# Patient Record
Sex: Female | Born: 1985
Health system: Southern US, Community
[De-identification: ages and names within clinical notes are randomized; demographics above are authoritative.]

## PROBLEM LIST (undated history)

## (undated) DIAGNOSIS — F1010 Alcohol abuse, uncomplicated: Secondary | ICD-10-CM

## (undated) DIAGNOSIS — I10 Essential (primary) hypertension: Secondary | ICD-10-CM

## (undated) HISTORY — DX: Alcohol abuse, uncomplicated: F10.10

## (undated) HISTORY — DX: Essential (primary) hypertension: I10

---

## 2016-12-16 ENCOUNTER — Encounter (HOSPITAL_BASED_OUTPATIENT_CLINIC_OR_DEPARTMENT_OTHER): Payer: Self-pay

## 2016-12-16 ENCOUNTER — Emergency Department (HOSPITAL_BASED_OUTPATIENT_CLINIC_OR_DEPARTMENT_OTHER)
Admission: EM | Admit: 2016-12-16 | Discharge: 2016-12-16 | Disposition: A | Discharge status: Home / Self Care | Payer: Self-pay | Attending: Emergency Medicine | Admitting: Emergency Medicine

## 2016-12-16 DIAGNOSIS — R519 Headache, unspecified: Secondary | ICD-10-CM

## 2016-12-16 DIAGNOSIS — A599 Trichomoniasis, unspecified: Secondary | ICD-10-CM | POA: Insufficient documentation

## 2016-12-16 DIAGNOSIS — E876 Hypokalemia: Secondary | ICD-10-CM | POA: Insufficient documentation

## 2016-12-16 DIAGNOSIS — R51 Headache: Secondary | ICD-10-CM | POA: Insufficient documentation

## 2016-12-16 LAB — BASIC METABOLIC PANEL
Anion gap: 14 (ref 5–15)
BUN: 14 mg/dL (ref 6–20)
CALCIUM: 8.6 mg/dL — AB (ref 8.9–10.3)
CO2: 23 mmol/L (ref 22–32)
CREATININE: 0.84 mg/dL (ref 0.44–1.00)
Chloride: 100 mmol/L — ABNORMAL LOW (ref 101–111)
GFR calc Af Amer: >60 mL/min (ref >60)
GLUCOSE: 103 mg/dL — AB (ref 65–99)
Potassium: 2.9 mmol/L — ABNORMAL LOW (ref 3.5–5.1)
Sodium: 137 mmol/L (ref 135–145)

## 2016-12-16 LAB — TSH: TSH: 0.443 u[IU]/mL (ref 0.350–4.500)

## 2016-12-16 LAB — WET PREP, GENITAL
Clue Cells Wet Prep HPF POC: NONE SEEN
Sperm: NONE SEEN
Yeast Wet Prep HPF POC: NONE SEEN

## 2016-12-16 LAB — PREGNANCY, URINE: PREG TEST UR: NEGATIVE

## 2016-12-16 LAB — CBC WITH DIFFERENTIAL/PLATELET
BASOS PCT: 1 %
Basophils Absolute: 0 10*3/uL (ref 0.0–0.1)
EOS ABS: 0 10*3/uL (ref 0.0–0.7)
Eosinophils Relative: 0 %
HCT: 41.7 % (ref 36.0–46.0)
HEMOGLOBIN: 15.3 g/dL — AB (ref 12.0–15.0)
LYMPHS ABS: 1.9 10*3/uL (ref 0.7–4.0)
Lymphocytes Relative: 24 %
MCH: 36.3 pg — AB (ref 26.0–34.0)
MCHC: 36.7 g/dL — AB (ref 30.0–36.0)
MCV: 99 fL (ref 78.0–100.0)
MONO ABS: 0.7 10*3/uL (ref 0.1–1.0)
MONOS PCT: 9 %
NEUTROS PCT: 67 %
Neutro Abs: 5.4 10*3/uL (ref 1.7–7.7)
Platelets: 170 10*3/uL (ref 150–400)
RBC: 4.21 MIL/uL (ref 3.87–5.11)
RDW: 12.3 % (ref 11.5–15.5)
WBC: 8.1 10*3/uL (ref 4.0–10.5)

## 2016-12-16 LAB — URINALYSIS, MICROSCOPIC (REFLEX)

## 2016-12-16 LAB — TROPONIN I: Troponin I: <0.03 ng/mL (ref <0.03)

## 2016-12-16 LAB — URINALYSIS, ROUTINE W REFLEX MICROSCOPIC
BILIRUBIN URINE: NEGATIVE
GLUCOSE, UA: NEGATIVE mg/dL
HGB URINE DIPSTICK: NEGATIVE
Ketones, ur: NEGATIVE mg/dL
Nitrite: NEGATIVE
PROTEIN: NEGATIVE mg/dL
SPECIFIC GRAVITY, URINE: 1.016 (ref 1.005–1.030)
pH: 7 (ref 5.0–8.0)

## 2016-12-16 MED ORDER — AZITHROMYCIN 250 MG PO TABS
2000.0000 mg | ORAL_TABLET | Freq: Once | ORAL | Status: AC
  Administered 2016-12-16: 2000 mg via ORAL
  Filled 2016-12-16: qty 8

## 2016-12-16 MED ORDER — ONDANSETRON 8 MG PO TBDP
8.0000 mg | ORAL_TABLET | Freq: Once | ORAL | Status: AC
  Administered 2016-12-16: 8 mg via ORAL
  Filled 2016-12-16: qty 1

## 2016-12-16 MED ORDER — METRONIDAZOLE 500 MG PO TABS: 500.0000 mg | ORAL_TABLET | Freq: Two times a day (BID) | ORAL | 0 refills | Status: DC

## 2016-12-16 MED ORDER — POTASSIUM CHLORIDE CRYS ER 20 MEQ PO TBCR: 20.0000 meq | EXTENDED_RELEASE_TABLET | Freq: Two times a day (BID) | ORAL | 0 refills | Status: DC

## 2016-12-16 MED ORDER — SODIUM CHLORIDE 0.9 % IV BOLUS (SEPSIS)
1000.0000 mL | Freq: Once | INTRAVENOUS | Status: AC
  Administered 2016-12-16: 1000 mL via INTRAVENOUS

## 2016-12-16 MED ORDER — AZITHROMYCIN 250 MG PO TABS: 1000.0000 mg | ORAL_TABLET | Freq: Once | ORAL | Status: DC

## 2016-12-16 MED ORDER — CIPROFLOXACIN HCL 500 MG PO TABS: ORAL_TABLET | ORAL | 0 refills | Status: DC

## 2016-12-16 MED FILL — metroNIDAZOLE 500 MG TABS: 500 | 7 days supply | Qty: 14 | Fill #0

## 2016-12-16 MED FILL — POTASSIUM CL ER 20 MEQ TABL: 20 | 5 days supply | Qty: 10 | Fill #0

## 2016-12-16 MED FILL — CIPROFLOXACIN HCL 500 MG TA: 500 | 5 days supply | Qty: 10 | Fill #0

## 2016-12-16 NOTE — Discharge Instructions (Signed)
Cipro and Flagyl as prescribed.  Potassium as prescribed.  We will call you if your cultures indicate you require further treatment or action.

## 2016-12-16 NOTE — ED Triage Notes (Signed)
C/o HA, nausea, fatigue-states she has not been dx with HTN but checks her BP and it has been elevated "for a while"-NAD-steady gait

## 2016-12-16 NOTE — ED Provider Notes (Signed)
[facility]   CSN: [id number] Arrival date & time: 12/16/16  1158     History   Chief Complaint Chief Complaint  Patient presents with  . Headache    HPI Emily English is a 31 y.o. female.  Patient is a 31 year old female who presents with a several day history of headache and not feeling well. This began in the absence of any injury or trauma. She denies any other complaints such as fever, chills, cough, sore throat, chest pain, or difficulty breathing. She does report she has been checking her blood pressure at work and it has been running high. She has no history of hypertension and is not on any medications. Last menstrual period was early this month and normal.   The history is provided by the patient.  Headache   This is a new problem. Episode onset: several days ago. The problem occurs constantly. The problem has been gradually worsening. The headache is associated with nothing. The pain is located in the frontal region. The pain is moderate. The pain does not radiate. Pertinent negatives include no fever, no malaise/fatigue, no nausea and no vomiting. The treatment provided no relief.    History reviewed. No pertinent past medical history.  There are no active problems to display for this patient.   History reviewed. No pertinent surgical history.  OB History    No data available       Home Medications    Prior to Admission medications   Not on File    Family History No family history on file.  Social History Social History  Substance Use Topics  . Smoking status: Never Smoker  . Smokeless tobacco: Never Used  . Alcohol use Yes     Comment: daily     Allergies   Keflex [cephalexin]   Review of Systems Review of Systems  Constitutional: Negative for fever and malaise/fatigue.  Gastrointestinal: Negative for nausea and vomiting.  Neurological: Positive for headaches.  All other systems reviewed and are  negative.    Physical Exam Updated Vital Signs BP (!) 140/103   Pulse (!) 118   Temp 98.4 F (36.9 C) (Oral)   Resp 18   Ht 5\' 2"  (1.575 m)   Wt 90.3 kg (199 lb)   LMP 11/29/2016   SpO2 98%   BMI 36.40 kg/m   Physical Exam  Constitutional: She is oriented to person, place, and time. She appears well-developed and well-nourished. No distress.  HENT:  Head: Normocephalic and atraumatic.  Neck: Normal range of motion. Neck supple.  Cardiovascular: Normal rate and regular rhythm.  Exam reveals no gallop and no friction rub.   No murmur heard. Pulmonary/Chest: Effort normal and breath sounds normal. No respiratory distress. She has no wheezes.  Abdominal: Soft. Bowel sounds are normal. She exhibits no distension. There is no tenderness.  Musculoskeletal: Normal range of motion.  Neurological: She is alert and oriented to person, place, and time.  Skin: Skin is warm and dry. She is not diaphoretic.  Nursing note and vitals reviewed.    ED Treatments / Results  Labs (all labs ordered are listed, but only abnormal results are displayed) Labs Reviewed  PREGNANCY, URINE  BASIC METABOLIC PANEL  TROPONIN I  CBC WITH DIFFERENTIAL/PLATELET  URINALYSIS, ROUTINE W REFLEX MICROSCOPIC    EKG  EKG Interpretation  Date/Time:  Thursday December 16 2016 12:13:32 EDT Ventricular Rate:  116 PR Interval:    QRS Duration: 88 QT Interval:  325 QTC Calculation:  452 R Axis:   -9 Text Interpretation:  Sinus tachycardia Anterior infarct, old Nonspecific T wave abnormality Confirmed by Geoffery Lyons ([id number]) on 12/16/2016 12:24:35 PM       Radiology No results found.  Procedures Procedures (including critical care time)  Medications Ordered in ED Medications  sodium chloride 0.9 % bolus 1,000 mL (not administered)     Initial Impression / Assessment and Plan / ED Course  I have reviewed the triage vital signs and the nursing notes.  Pertinent labs & imaging results that were  available during my care of the patient were reviewed by me and considered in my medical decision making (see chart for details).  Patient presents here with complaints of headache in the absence of any injury or trauma. She is neurologically intact, afebrile, with no nuchal rigidity. She is found to be somewhat tachycardic with a heart rate in the 110's, but no chest pain, shortness of breath, or tachycardia. Laboratory studies are unremarkable with the exception of mild hypokalemia with K+ of 2.9.  She will be discharged with cipro, potassium replacement, and prn follow up.  Prior to discharge, it was brought to my attention that there was trichomonas in the urinalysis. For this reason, a pelvic exam was obtained and GC and Chlamydia cultures were obtained as well. She has a cephalosporin allergy so will be treated with 2 g of by mouth Zithromax and discharged with Cipro and Flagyl for a UTI and Trichomonas.  Final Clinical Impressions(s) / ED Diagnoses   Final diagnoses:  None    New Prescriptions New Prescriptions   No medications on file     Geoffery Lyons, MD 12/16/16 1500

## 2016-12-16 NOTE — ED Notes (Signed)
ED Provider at bedside. 

## 2016-12-17 LAB — GC/CHLAMYDIA PROBE AMP (~~LOC~~) NOT AT ARMC
CHLAMYDIA, DNA PROBE: NEGATIVE
NEISSERIA GONORRHEA: NEGATIVE

## 2018-09-12 ENCOUNTER — Ambulatory Visit: Payer: Self-pay | Admitting: Family Medicine

## 2019-08-23 DIAGNOSIS — Z03818 Encounter for observation for suspected exposure to other biological agents ruled out: Secondary | ICD-10-CM | POA: Diagnosis not present

## 2019-10-31 DIAGNOSIS — R197 Diarrhea, unspecified: Secondary | ICD-10-CM | POA: Diagnosis not present

## 2019-10-31 DIAGNOSIS — R112 Nausea with vomiting, unspecified: Secondary | ICD-10-CM | POA: Diagnosis not present

## 2019-10-31 DIAGNOSIS — Z1159 Encounter for screening for other viral diseases: Secondary | ICD-10-CM | POA: Diagnosis not present

## 2019-10-31 DIAGNOSIS — Z20822 Contact with and (suspected) exposure to covid-19: Secondary | ICD-10-CM | POA: Diagnosis not present

## 2019-10-31 DIAGNOSIS — R109 Unspecified abdominal pain: Secondary | ICD-10-CM | POA: Diagnosis not present

## 2019-11-01 DIAGNOSIS — R569 Unspecified convulsions: Secondary | ICD-10-CM | POA: Diagnosis not present

## 2019-11-01 DIAGNOSIS — E876 Hypokalemia: Secondary | ICD-10-CM | POA: Diagnosis not present

## 2019-11-01 DIAGNOSIS — R410 Disorientation, unspecified: Secondary | ICD-10-CM | POA: Diagnosis not present

## 2019-11-01 DIAGNOSIS — E878 Other disorders of electrolyte and fluid balance, not elsewhere classified: Secondary | ICD-10-CM | POA: Diagnosis not present

## 2019-11-01 DIAGNOSIS — W01198A Fall on same level from slipping, tripping and stumbling with subsequent striking against other object, initial encounter: Secondary | ICD-10-CM | POA: Diagnosis not present

## 2019-11-01 DIAGNOSIS — R58 Hemorrhage, not elsewhere classified: Secondary | ICD-10-CM | POA: Diagnosis not present

## 2019-11-01 DIAGNOSIS — G40901 Epilepsy, unspecified, not intractable, with status epilepticus: Secondary | ICD-10-CM | POA: Diagnosis not present

## 2019-11-01 DIAGNOSIS — N390 Urinary tract infection, site not specified: Secondary | ICD-10-CM | POA: Diagnosis not present

## 2019-11-01 DIAGNOSIS — S0101XA Laceration without foreign body of scalp, initial encounter: Secondary | ICD-10-CM | POA: Diagnosis not present

## 2019-11-01 DIAGNOSIS — Z23 Encounter for immunization: Secondary | ICD-10-CM | POA: Diagnosis not present

## 2019-11-01 DIAGNOSIS — S0083XA Contusion of other part of head, initial encounter: Secondary | ICD-10-CM | POA: Diagnosis not present

## 2019-11-01 DIAGNOSIS — G40909 Epilepsy, unspecified, not intractable, without status epilepticus: Secondary | ICD-10-CM | POA: Diagnosis not present

## 2019-11-02 DIAGNOSIS — R000 Tachycardia, unspecified: Secondary | ICD-10-CM | POA: Diagnosis not present

## 2019-11-05 DIAGNOSIS — K760 Fatty (change of) liver, not elsewhere classified: Secondary | ICD-10-CM | POA: Diagnosis not present

## 2019-11-05 DIAGNOSIS — Z79899 Other long term (current) drug therapy: Secondary | ICD-10-CM | POA: Diagnosis not present

## 2019-11-05 DIAGNOSIS — Z20822 Contact with and (suspected) exposure to covid-19: Secondary | ICD-10-CM | POA: Diagnosis not present

## 2019-11-05 DIAGNOSIS — Z09 Encounter for follow-up examination after completed treatment for conditions other than malignant neoplasm: Secondary | ICD-10-CM | POA: Diagnosis not present

## 2019-11-05 DIAGNOSIS — S0101XS Laceration without foreign body of scalp, sequela: Secondary | ICD-10-CM | POA: Diagnosis not present

## 2019-11-09 DIAGNOSIS — Z79899 Other long term (current) drug therapy: Secondary | ICD-10-CM | POA: Diagnosis not present

## 2019-11-12 DIAGNOSIS — R945 Abnormal results of liver function studies: Secondary | ICD-10-CM | POA: Diagnosis not present

## 2019-11-12 DIAGNOSIS — S0101XD Laceration without foreign body of scalp, subsequent encounter: Secondary | ICD-10-CM | POA: Diagnosis not present

## 2020-12-04 ENCOUNTER — Emergency Department (HOSPITAL_COMMUNITY): Payer: No Typology Code available for payment source

## 2020-12-04 ENCOUNTER — Encounter (HOSPITAL_COMMUNITY): Payer: Self-pay | Admitting: Emergency Medicine

## 2020-12-04 ENCOUNTER — Emergency Department (HOSPITAL_COMMUNITY)
Admission: EM | Admit: 2020-12-04 | Discharge: 2020-12-04 | Discharge status: Against Medical Advice | Payer: No Typology Code available for payment source | Attending: Emergency Medicine | Admitting: Emergency Medicine

## 2020-12-04 DIAGNOSIS — I10 Essential (primary) hypertension: Secondary | ICD-10-CM

## 2020-12-04 DIAGNOSIS — Y907 Blood alcohol level of 200-239 mg/100 ml: Secondary | ICD-10-CM | POA: Insufficient documentation

## 2020-12-04 DIAGNOSIS — F10129 Alcohol abuse with intoxication, unspecified: Secondary | ICD-10-CM | POA: Insufficient documentation

## 2020-12-04 DIAGNOSIS — Z5321 Procedure and treatment not carried out due to patient leaving prior to being seen by health care provider: Secondary | ICD-10-CM | POA: Diagnosis not present

## 2020-12-04 DIAGNOSIS — R0789 Other chest pain: Secondary | ICD-10-CM | POA: Diagnosis not present

## 2020-12-04 DIAGNOSIS — R002 Palpitations: Secondary | ICD-10-CM | POA: Insufficient documentation

## 2020-12-04 DIAGNOSIS — E876 Hypokalemia: Secondary | ICD-10-CM | POA: Diagnosis not present

## 2020-12-04 DIAGNOSIS — R5383 Other fatigue: Secondary | ICD-10-CM

## 2020-12-04 DIAGNOSIS — F10920 Alcohol use, unspecified with intoxication, uncomplicated: Secondary | ICD-10-CM

## 2020-12-04 DIAGNOSIS — R079 Chest pain, unspecified: Secondary | ICD-10-CM

## 2020-12-04 DIAGNOSIS — R55 Syncope and collapse: Secondary | ICD-10-CM | POA: Insufficient documentation

## 2020-12-04 LAB — TROPONIN I (HIGH SENSITIVITY)
Troponin I (High Sensitivity): <2 ng/L (ref <18)
Troponin I (High Sensitivity): <2 ng/L (ref <18)

## 2020-12-04 LAB — BASIC METABOLIC PANEL
Anion gap: 10 (ref 5–15)
BUN: <5 mg/dL — ABNORMAL LOW (ref 6–20)
CO2: 29 mmol/L (ref 22–32)
Calcium: 7 mg/dL — ABNORMAL LOW (ref 8.9–10.3)
Chloride: 99 mmol/L (ref 98–111)
Creatinine, Ser: 0.52 mg/dL (ref 0.44–1.00)
GFR, Estimated: >60 mL/min (ref >60)
Glucose, Bld: 111 mg/dL — ABNORMAL HIGH (ref 70–99)
Potassium: 2.7 mmol/L — CL (ref 3.5–5.1)
Sodium: 138 mmol/L (ref 135–145)

## 2020-12-04 LAB — CBC
HCT: 37.5 % (ref 36.0–46.0)
Hemoglobin: 13.2 g/dL (ref 12.0–15.0)
MCH: 38.9 pg — ABNORMAL HIGH (ref 26.0–34.0)
MCHC: 35.2 g/dL (ref 30.0–36.0)
MCV: 110.6 fL — ABNORMAL HIGH (ref 80.0–100.0)
Platelets: 183 10*3/uL (ref 150–400)
RBC: 3.39 MIL/uL — ABNORMAL LOW (ref 3.87–5.11)
RDW: 16.5 % — ABNORMAL HIGH (ref 11.5–15.5)
WBC: 3.3 10*3/uL — ABNORMAL LOW (ref 4.0–10.5)
nRBC: 0 % (ref 0.0–0.2)

## 2020-12-04 LAB — HEPATIC FUNCTION PANEL
ALT: 26 U/L (ref 0–44)
AST: 44 U/L — ABNORMAL HIGH (ref 15–41)
Albumin: 3.5 g/dL (ref 3.5–5.0)
Alkaline Phosphatase: 47 U/L (ref 38–126)
Bilirubin, Direct: 0.1 mg/dL (ref 0.0–0.2)
Indirect Bilirubin: 0.4 mg/dL (ref 0.3–0.9)
Total Bilirubin: 0.5 mg/dL (ref 0.3–1.2)
Total Protein: 6.9 g/dL (ref 6.5–8.1)

## 2020-12-04 LAB — I-STAT BETA HCG BLOOD, ED (MC, WL, AP ONLY): I-stat hCG, quantitative: <5 m[IU]/mL (ref <5)

## 2020-12-04 LAB — TSH: TSH: 0.716 u[IU]/mL (ref 0.350–4.500)

## 2020-12-04 LAB — ETHANOL: Alcohol, Ethyl (B): 222 mg/dL — ABNORMAL HIGH (ref <10)

## 2020-12-04 LAB — MAGNESIUM: Magnesium: 1.6 mg/dL — ABNORMAL LOW (ref 1.7–2.4)

## 2020-12-04 MED ORDER — POTASSIUM CHLORIDE CRYS ER 20 MEQ PO TBCR: 20.0000 meq | EXTENDED_RELEASE_TABLET | Freq: Every day | ORAL | 0 refills | Status: AC

## 2020-12-04 MED ORDER — MAGNESIUM SULFATE IN D5W 1-5 GM/100ML-% IV SOLN
1.0000 g | Freq: Once | INTRAVENOUS | Status: DC
  Filled 2020-12-04: qty 100

## 2020-12-04 MED ORDER — LISINOPRIL 10 MG PO TABS
10.0000 mg | ORAL_TABLET | Freq: Once | ORAL | Status: AC
  Administered 2020-12-04: 10 mg via ORAL
  Filled 2020-12-04: qty 1

## 2020-12-04 MED ORDER — POTASSIUM CHLORIDE CRYS ER 20 MEQ PO TBCR
40.0000 meq | EXTENDED_RELEASE_TABLET | Freq: Once | ORAL | Status: AC
  Administered 2020-12-04: 40 meq via ORAL
  Filled 2020-12-04: qty 2

## 2020-12-04 MED ORDER — LISINOPRIL 10 MG PO TABS: 10.0000 mg | ORAL_TABLET | Freq: Every day | ORAL | 0 refills | Status: DC

## 2020-12-04 NOTE — ED Triage Notes (Signed)
Patient here for evaluation of fatigue and shortness of breath that started a few months ago. Patient was sent to ED by employer after EKG for further evaluation. Patient reports intermittent palpitations. Patient alert, oriented, and in no apparent distress at this time.

## 2020-12-04 NOTE — ED Provider Notes (Signed)
[facility] EMERGENCY DEPARTMENT Provider Note   CSN: [id number] Arrival date & time: 12/04/20  1025     History Chief Complaint  Patient presents with   Fatigue    Emily English is a 35 y.o. female.  35 year old female with history of hypertension, previously prescribed Lisinopril but has not been compliant with, presents with syncopal event today and feeling unwell. Patient reports feeling generally unwell recently, went into work today around 7:30AM, was found by staff "slumped over at the desk." Patient works in a doctor's office, an EKG was completed and she was sent to the ER. Patient does not recall anything out of the ordinary for her this morning, states she had a headache yesterday and took IBU without much relief.  States that she has had chest discomfort through the center of her chest which sometimes radiates towards the left or to the right, occasionally associated with palpitations, generalized fagitue.  Patient states that she was seen last month in urgent care for sore throat, found to be positive for strep and treated with Zithromax however during that visit her heart rate was elevated and she had an EKG showing frequent PVCs.  Patient was referred to the emergency room however declined evaluation at that time. Reports history of hypokalemia, occasionally takes an OTC supplement, unknown why her K is low, not on dieretics. Denies any other medical history.       History reviewed. No pertinent past medical history.  There are no problems to display for this patient.   History reviewed. No pertinent surgical history.   OB History   No obstetric history on file.     No family history on file.  Social History   Tobacco Use   Smoking status: Never   Smokeless tobacco: Never  Substance Use Topics   Alcohol use: Yes    Comment: daily   Drug use: No    Home Medications Prior to Admission medications   Medication Sig Start Date End Date  Taking? Authorizing Provider  Calcium Carb-Cholecalciferol (CALCIUM-VITAMIN D) 600-400 MG-UNIT TABS Take 1 tablet by mouth daily. 02/15/19  Yes [provider]  folic acid (FOLVITE) 1 MG tablet Take 1 mg by mouth daily. 02/16/19  Yes [provider]  gabapentin (NEURONTIN) 100 MG capsule Take 100 mg by mouth every 30 (thirty) days. 02/15/19  Yes [provider]  lisinopril (ZESTRIL) 10 MG tablet Take 1 tablet (10 mg total) by mouth daily. 12/04/20 01/03/21 Yes Jeannie Fend, PA-C  Magnesium 250 MG TABS Take 1 tablet by mouth daily.   Yes [provider]  Multiple Vitamin (THERA) TABS Take 1 tablet by mouth every 30 (thirty) days. 02/16/19  Yes [provider]  Omega-3 Fatty Acids (FISH OIL) 1000 MG CAPS Take 1 capsule by mouth daily.   Yes [provider]  potassium chloride SA (KLOR-CON) 20 MEQ tablet Take 1 tablet (20 mEq total) by mouth daily for 5 days. 12/04/20 12/09/20 Yes Jeannie Fend, PA-C  ciprofloxacin (CIPRO) 500 MG tablet One po bid x 5 days Patient not taking: No sig reported 12/16/16   Geoffery Lyons, MD  metroNIDAZOLE (FLAGYL) 500 MG tablet Take 1 tablet (500 mg total) by mouth 2 (two) times daily. One po bid x 7 days Patient not taking: No sig reported 12/16/16   Geoffery Lyons, MD    Allergies    Keflex [cephalexin]  Review of Systems   Review of Systems  Constitutional:  Positive for fatigue.  Negative for fever.  Respiratory:  Negative for shortness of breath.   Cardiovascular:  Positive for chest pain and palpitations. Negative for leg swelling.  Gastrointestinal:  Positive for nausea. Negative for abdominal pain, constipation, diarrhea and vomiting.  Musculoskeletal:  Negative for arthralgias and myalgias.  Skin:  Negative for rash and wound.  Allergic/Immunologic: Negative for immunocompromised state.  Neurological:  Positive for syncope and headaches. Negative for weakness.  Psychiatric/Behavioral:  Negative for confusion.    All other systems reviewed and are negative.  Physical Exam Updated Vital Signs BP (!) 143/101   Pulse 84   Temp 98.4 F (36.9 C) (Oral)   Resp 18   LMP 11/30/2020   SpO2 97%   Physical Exam Vitals and nursing note reviewed.  Constitutional:      General: She is not in acute distress.    Appearance: She is well-developed. She is not diaphoretic.  HENT:     Head: Normocephalic and atraumatic.  Eyes:     Extraocular Movements: Extraocular movements intact.     Pupils: Pupils are equal, round, and reactive to light.  Cardiovascular:     Rate and Rhythm: Normal rate and regular rhythm.     Pulses: Normal pulses.     Heart sounds: Normal heart sounds.  Pulmonary:     Effort: Pulmonary effort is normal.     Breath sounds: Normal breath sounds.  Abdominal:     Palpations: Abdomen is soft.     Tenderness: There is no abdominal tenderness.  Musculoskeletal:     Cervical back: Neck supple.     Right lower leg: No edema.     Left lower leg: No edema.  Skin:    General: Skin is warm and dry.     Findings: No erythema or rash.  Neurological:     Mental Status: She is alert and oriented to person, place, and time.  Psychiatric:        Attention and Perception: Attention normal.        Mood and Affect: Affect is flat.        Speech: Speech normal.        Behavior: Behavior is withdrawn.    ED Results / Procedures / Treatments   Labs (all labs ordered are listed, but only abnormal results are displayed) Labs Reviewed  BASIC METABOLIC PANEL - Abnormal; Notable for the following components:      Result Value   Potassium 2.7 (*)    Glucose, Bld 111 (*)    BUN <5 (*)    Calcium 7.0 (*)    All other components within normal limits  CBC - Abnormal; Notable for the following components:   WBC 3.3 (*)    RBC 3.39 (*)    MCV 110.6 (*)    MCH 38.9 (*)    RDW 16.5 (*)    All other components within normal limits  MAGNESIUM - Abnormal; Notable for the following components:    Magnesium 1.6 (*)    All other components within normal limits  HEPATIC FUNCTION PANEL - Abnormal; Notable for the following components:   AST 44 (*)    All other components within normal limits  ETHANOL - Abnormal; Notable for the following components:   Alcohol, Ethyl (B) 222 (*)    All other components within normal limits  TSH  RAPID URINE DRUG SCREEN, HOSP PERFORMED  I-STAT BETA HCG BLOOD, ED ([facility], [facility], [facility] ONLY)  TROPONIN I (HIGH SENSITIVITY)  TROPONIN I (HIGH SENSITIVITY)  EKG EKG Interpretation  Date/Time:  Thursday December 04 2020 10:51:20 EDT Ventricular Rate:  96 PR Interval:  158 QRS Duration: 76 QT Interval:  384 QTC Calculation: 485 R Axis:   33 Text Interpretation: Normal sinus rhythm Low voltage QRS Nonspecific ST abnormality Prolonged QT Abnormal ECG Confirmed by Benjiman Core [phone]) on 12/04/2020 1:10:13 PM  Radiology DG Chest 2 View  Result Date: 12/04/2020 CLINICAL DATA:  Mid chest pain, shortness of breath and weakness. EXAM: CHEST - 2 VIEW COMPARISON:  None. FINDINGS: Lungs clear. Heart size normal. No pneumothorax or pleural fluid. No acute or focal bony abnormality. IMPRESSION: Negative chest. Electronically Signed   By: Drusilla Kanner M.D.   On: 12/04/2020 11:36    Procedures Procedures   Medications Ordered in ED Medications  magnesium sulfate IVPB 1 g 100 mL (has no administration in time range)  lisinopril (ZESTRIL) tablet 10 mg (10 mg Oral Given 12/04/20 1342)  potassium chloride SA (KLOR-CON) CR tablet 40 mEq (40 mEq Oral Given 12/04/20 1342)    ED Course  I have reviewed the triage vital signs and the nursing notes.  Pertinent labs & imaging results that were available during my care of the patient were reviewed by me and considered in my medical decision making (see chart for details).  Clinical Course as of 12/04/20 1522  Thu Dec 04, 2020  [id number] 35 year old female presents after possible syncopal episode at work today. Patient reports going  into work at 7:30AM today, was found by staff sitting at her desk slumped over. An EKG was done in office and she was sent to the ER for evaluation. States she has not been feeling well recently, occasional CP and palpitations with fatigue for the past few months. Reports history of HTN, not medicated and denies any other history although does report occasional hypo K and Mg. CBC with WBC 3.3, BMP with K 2.7 (given oral K), Ca 7.0 (8.6 3 years ago). Additional labs added to evaluate for cause of her feeling generally unwell as well as concern for syncope today.  [LM]  1511 Mg slightly low at 1.6, plan is for dose of Mg IV. Discussed results with patient. States she does not have cancer, had a nodule in her lung that was biopsied and negative. Discussed elevated alcohol level today. States she has been a heavy drinker in the past, is not currently.  Recommend admission for her electrolyte disturbance today and syncope of undetermined cause. Patient refuses admission at this time. Plan will be for leaving AMA after her magnesium, care signed out pending final disposition.  [LM]    Clinical Course User Index [LM] Alden Hipp   MDM Rules/Calculators/A&P                           Final Clinical Impression(s) / ED Diagnoses Final diagnoses:  Fatigue, unspecified type  Chest pain, unspecified type  Hypokalemia  Hypomagnesemia  Hypocalcemia  Hypertension, unspecified type  Alcoholic intoxication without complication (HCC)  Syncope, unspecified syncope type    Rx / DC Orders ED Discharge Orders          Ordered    potassium chloride SA (KLOR-CON) 20 MEQ tablet  Daily        12/04/20 1516    lisinopril (ZESTRIL) 10 MG tablet  Daily        12/04/20 1516             Eulah Pont,  Gerome Apley, PA-C 12/04/20 1522    Benjiman Core, MD 12/25/20 1501

## 2020-12-04 NOTE — ED Provider Notes (Cosign Needed)
Pt report feeling unwell.  Was slumped over at desk at work today.  Has electrolytes abnormalities includes hypokalemia and hypocalcemia, hypomagnesia.  Pt is intoxicated.   At this time patient refused magnesium supplementation and request to be discharged home.  Family members at bedside.  Patient will be leaving AMA.  BP 114/79   Pulse 74   Temp 98.4 F (36.9 C) (Oral)   Resp 17   LMP 11/30/2020   SpO2 100%   Results for orders placed or performed during the hospital encounter of 12/04/20  Basic metabolic panel  Result Value Ref Range   Sodium 138 135 - 145 mmol/L   Potassium 2.7 (LL) 3.5 - 5.1 mmol/L   Chloride 99 98 - 111 mmol/L   CO2 29 22 - 32 mmol/L   Glucose, Bld 111 (H) 70 - 99 mg/dL   BUN <5 (L) 6 - 20 mg/dL   Creatinine, Ser 5.17 0.44 - 1.00 mg/dL   Calcium 7.0 (L) 8.9 - 10.3 mg/dL   GFR, Estimated >61 >60 mL/min   Anion gap 10 5 - 15  CBC  Result Value Ref Range   WBC 3.3 (L) 4.0 - 10.5 K/uL   RBC 3.39 (L) 3.87 - 5.11 MIL/uL   Hemoglobin 13.2 12.0 - 15.0 g/dL   HCT 73.7 10.6 - 26.9 %   MCV 110.6 (H) 80.0 - 100.0 fL   MCH 38.9 (H) 26.0 - 34.0 pg   MCHC 35.2 30.0 - 36.0 g/dL   RDW 48.5 (H) 46.2 - 70.3 %   Platelets 183 150 - 400 K/uL   nRBC 0.0 0.0 - 0.2 %  Magnesium  Result Value Ref Range   Magnesium 1.6 (L) 1.7 - 2.4 mg/dL  TSH  Result Value Ref Range   TSH 0.716 0.350 - 4.500 uIU/mL  Hepatic function panel  Result Value Ref Range   Total Protein 6.9 6.5 - 8.1 g/dL   Albumin 3.5 3.5 - 5.0 g/dL   AST 44 (H) 15 - 41 U/L   ALT 26 0 - 44 U/L   Alkaline Phosphatase 47 38 - 126 U/L   Total Bilirubin 0.5 0.3 - 1.2 mg/dL   Bilirubin, Direct 0.1 0.0 - 0.2 mg/dL   Indirect Bilirubin 0.4 0.3 - 0.9 mg/dL  Ethanol  Result Value Ref Range   Alcohol, Ethyl (B) 222 (H) <10 mg/dL  I-Stat beta hCG blood, ED  Result Value Ref Range   I-stat hCG, quantitative <5.0 <5 mIU/mL   Comment 3          Troponin I (High Sensitivity)  Result Value Ref Range   Troponin I  (High Sensitivity) <2 <18 ng/L  Troponin I (High Sensitivity)  Result Value Ref Range   Troponin I (High Sensitivity) <2 <18 ng/L   DG Chest 2 View  Result Date: 12/04/2020 CLINICAL DATA:  Mid chest pain, shortness of breath and weakness. EXAM: CHEST - 2 VIEW COMPARISON:  None. FINDINGS: Lungs clear. Heart size normal. No pneumothorax or pleural fluid. No acute or focal bony abnormality. IMPRESSION: Negative chest. Electronically Signed   By: Drusilla Kanner M.D.   On: 12/04/2020 11:36      Fayrene Helper, PA-C 12/04/20 1622

## 2020-12-04 NOTE — Discharge Instructions (Addendum)
Recommend admission to the hospital, if you choose to leave against medical advice, recommend close follow up with your doctor. Potassium supplement sent to your pharmacy as well as refill of your lisinopril.

## 2021-03-31 DIAGNOSIS — R569 Unspecified convulsions: Secondary | ICD-10-CM

## 2021-03-31 HISTORY — DX: Unspecified convulsions: R56.9

## 2021-04-20 ENCOUNTER — Encounter: Payer: Self-pay | Admitting: Diagnostic Neuroimaging

## 2021-04-20 ENCOUNTER — Ambulatory Visit: Payer: No Typology Code available for payment source | Admitting: Diagnostic Neuroimaging

## 2021-04-20 ENCOUNTER — Ambulatory Visit: Payer: Self-pay | Admitting: Cardiology

## 2021-04-20 VITALS — BP 161/109 | HR 122 | Ht 62.25 in | Wt 215.2 lb

## 2021-04-20 DIAGNOSIS — G40909 Epilepsy, unspecified, not intractable, without status epilepticus: Secondary | ICD-10-CM

## 2021-04-20 NOTE — Progress Notes (Signed)
[facility]  PATIENT: Emily English DOB: [birth year]  REFERRING CLINICIAN: Linus Galas, NP HISTORY FROM: patient  REASON FOR VISIT: new consult    HISTORICAL  CHIEF COMPLAINT:  Chief Complaint  Patient presents with   Seizures    Rm 7 New Pt     HISTORY OF PRESENT ILLNESS:   35 year old female with alcohol abuse here for evaluation of seizures.  In 2019 patient's husband was diagnosed with alcoholic cirrhosis, and told he only had a few years to live.  Around this time patient started drinking alcohol herself.  In August 2020 she decided to stop drinking alcohol and had a withdrawal seizure.  She had 2 more seizures in December 2020 and May 2021, possibly related to alcohol use but not clear.     Patient's husband passed away in Aug 31, 2020.  She has been struggling with grieving and ongoing alcohol abuse and dependence.  In October 2022 she had another alcohol withdrawal seizure.  She has been drinking variable amounts of alcohol ranging as high as half gallon of vodka every 2 days or fewer drinks 3-4 times per week.  When she wakes up in the morning she does not feel tremors or cravings.  She still able to go to work and function for the most part.     REVIEW OF SYSTEMS: Full 14 system review of systems performed and negative with exception of: as per HPI.  ALLERGIES: Allergies  Allergen Reactions   Keflex [Cephalexin] Rash    HOME MEDICATIONS: Outpatient Medications Prior to Visit  Medication Sig Dispense Refill   Magnesium 250 MG TABS Take 1 tablet by mouth daily.     metoprolol succinate (TOPROL-XL) 25 MG 24 hr tablet Take 25 mg by mouth daily.     Multiple Vitamin (THERA) TABS Take 1 tablet by mouth every 30 (thirty) days.     Omega-3 Fatty Acids (FISH OIL) 1000 MG CAPS Take 1 capsule by mouth daily.     potassium chloride SA (KLOR-CON) 20 MEQ tablet Take 1 tablet (20 mEq total) by mouth daily for 5 days. 5 tablet 0   Calcium  Carb-Cholecalciferol (CALCIUM-VITAMIN D) 600-400 MG-UNIT TABS Take 1 tablet by mouth daily.     ciprofloxacin (CIPRO) 500 MG tablet One po bid x 5 days (Patient not taking: No sig reported) 10 tablet 0   folic acid (FOLVITE) 1 MG tablet Take 1 mg by mouth daily. (Patient not taking: Reported on 04/20/2021)     gabapentin (NEURONTIN) 100 MG capsule Take 100 mg by mouth every 30 (thirty) days. (Patient not taking: Reported on 04/20/2021)     lisinopril (ZESTRIL) 10 MG tablet Take 1 tablet (10 mg total) by mouth daily. 30 tablet 0   LORazepam (ATIVAN) 0.5 MG tablet Take 0.5 mg by mouth daily as needed. 04/20/21 unsure if she'll fill it     metroNIDAZOLE (FLAGYL) 500 MG tablet Take 1 tablet (500 mg total) by mouth 2 (two) times daily. One po bid x 7 days (Patient not taking: No sig reported) 14 tablet 0   No facility-administered medications prior to visit.    PAST MEDICAL HISTORY: Past Medical History:  Diagnosis Date   Alcohol abuse    Hypertension    Seizures (HCC) 03/31/2021   1st seizure 2019    PAST SURGICAL HISTORY: History reviewed. No pertinent surgical history.  FAMILY HISTORY: Family History  Problem Relation Age of Onset   Thyroid disease Mother    Hypertension Mother  SOCIAL HISTORY: Social History   Socioeconomic History   Marital status: Widowed    Spouse name: Not on file   Number of children: 0   Years of education: Not on file   Highest education level: Associate degree: academic program  Occupational History    Comment: Infusion co ordinator for [employer]  Tobacco Use   Smoking status: Every Day    Types: Cigarettes   Smokeless tobacco: Never   Tobacco comments:    04/20/21 1-2 cigs daily  Substance and Sexual Activity   Alcohol use: Yes    Comment: daily, 04/20/21 I can go several days without it   Drug use: No   Sexual activity: Yes    Birth control/protection: None  Other Topics Concern   Not on file  Social History Narrative   Lives with  parents   Social Determinants of Health   Financial Resource Strain: Not on file  Food Insecurity: Not on file  Transportation Needs: Not on file  Physical Activity: Not on file  Stress: Not on file  Social Connections: Not on file  Intimate Partner Violence: Not on file     PHYSICAL EXAM  GENERAL EXAM/CONSTITUTIONAL: Vitals:  Vitals:   04/20/21 1003  BP: (!) 161/109  Pulse: (!) 122  Weight: 215 lb 3.2 oz (97.6 kg)  Height: 5' 2.25" (1.581 m)   Body mass index is 39.05 kg/m. Wt Readings from Last 3 Encounters:  04/20/21 215 lb 3.2 oz (97.6 kg)  12/16/16 199 lb (90.3 kg)   Patient is in no distress; well developed, nourished and groomed; neck is supple  CARDIOVASCULAR: Examination of carotid arteries is normal; no carotid bruits Regular rate and rhythm, no murmurs Examination of peripheral vascular system by observation and palpation is normal  EYES: Ophthalmoscopic exam of optic discs and posterior segments is normal; no papilledema or hemorrhages No results found.  MUSCULOSKELETAL: Gait, strength, tone, movements noted in Neurologic exam below  NEUROLOGIC: MENTAL STATUS:  No flowsheet data found. awake, alert, oriented to person, place and time recent and remote memory intact normal attention and concentration language fluent, comprehension intact, naming intact fund of knowledge appropriate  CRANIAL NERVE:  2nd - no papilledema on fundoscopic exam 2nd, 3rd, 4th, 6th - pupils equal and reactive to light, visual fields full to confrontation, extraocular muscles intact, no nystagmus 5th - facial sensation symmetric 7th - facial strength symmetric 8th - hearing intact 9th - palate elevates symmetrically, uvula midline 11th - shoulder shrug symmetric 12th - tongue protrusion midline  MOTOR:  normal bulk and tone, full strength in the BUE, BLE  SENSORY:  normal and symmetric to light touch, temperature, vibration  COORDINATION:  finger-nose-finger,  fine finger movements normal  REFLEXES:  deep tendon reflexes present and symmetric  GAIT/STATION:  narrow based gait     DIAGNOSTIC DATA (LABS, IMAGING, TESTING) - I reviewed patient records, labs, notes, testing and imaging myself where available.  Lab Results  Component Value Date   WBC 3.3 (L) 12/04/2020   HGB 13.2 12/04/2020   HCT 37.5 12/04/2020   MCV 110.6 (H) 12/04/2020   PLT 183 12/04/2020      Component Value Date/Time   NA 138 12/04/2020 1100   K 2.7 (LL) 12/04/2020 1100   CL 99 12/04/2020 1100   CO2 29 12/04/2020 1100   GLUCOSE 111 (H) 12/04/2020 1100   BUN <5 (L) 12/04/2020 1100   CREATININE 0.52 12/04/2020 1100   CALCIUM 7.0 (L) 12/04/2020 1100   PROT  6.9 12/04/2020 1324   ALBUMIN 3.5 12/04/2020 1324   AST 44 (H) 12/04/2020 1324   ALT 26 12/04/2020 1324   ALKPHOS 47 12/04/2020 1324   BILITOT 0.5 12/04/2020 1324   GFRNONAA >60 12/04/2020 1100   GFRAA >60 12/16/2016 1253   No results found for: CHOL, HDL, LDLCALC, LDLDIRECT, TRIG, CHOLHDL No results found for: OTLX7W No results found for: VITAMINB12 Lab Results  Component Value Date   TSH 0.716 12/04/2020      ASSESSMENT AND PLAN  35 y.o. year old female here with:    Dx:  1. Seizure disorder (HCC)       PLAN:  SEIZURE EVALUATION (likely related to alcohol withdrawal and alcohol dependence/abuse)  - MRI brain, EEG (rule out other causes)  - consider psychiatry / rehab evaluation for alcohol dependence  - According to [place] law, you can not drive unless you are seizure / syncope free for at least 6 months and under physician's care.   - Please maintain precautions. Do not participate in activities where a loss of awareness could harm you or someone else. No swimming alone, no tub bathing, no hot tubs, no driving, no operating motorized vehicles (cars, ATVs, motocycles, etc), lawnmowers, power tools or firearms. No standing at heights, such as rooftops, ladders or stairs. Avoid hot  objects such as stoves, heaters, open fires. Wear a helmet when riding a bicycle, scooter, skateboard, etc. and avoid areas of traffic. Set your water heater to 120 degrees or less.   Orders Placed This Encounter  Procedures   MR BRAIN W WO CONTRAST   EEG adult   Return in about 6 months (around 10/19/2021) for pending test results, MyChart visit ( ).    Suanne Marker, MD 04/20/2021, 11:14 AM Certified in Neurology, Neurophysiology and Neuroimaging  [facility] [address] [place], [place] [address] [phone]

## 2021-04-20 NOTE — Patient Instructions (Addendum)
-   seizure evaluation  - MRI brain, EEG  - consider psychiatry / rehab evaluation for alcohol dependence  - According to [place] law, you can not drive unless you are seizure / syncope free for at least 6 months and under physician's care.   - Please maintain precautions. Do not participate in activities where a loss of awareness could harm you or someone else. No swimming alone, no tub bathing, no hot tubs, no driving, no operating motorized vehicles (cars, ATVs, motocycles, etc), lawnmowers, power tools or firearms. No standing at heights, such as rooftops, ladders or stairs. Avoid hot objects such as stoves, heaters, open fires. Wear a helmet when riding a bicycle, scooter, skateboard, etc. and avoid areas of traffic. Set your water heater to 120 degrees or less.

## 2021-04-22 ENCOUNTER — Ambulatory Visit: Payer: Self-pay | Admitting: Cardiology

## 2021-04-22 ENCOUNTER — Telehealth: Payer: Self-pay | Admitting: Diagnostic Neuroimaging

## 2021-04-22 NOTE — Telephone Encounter (Signed)
LVM for pt to call back to schedule  [facility] auth: [id number] (exp. 04/22/21 to 06/06/21)

## 2021-04-28 ENCOUNTER — Other Ambulatory Visit: Payer: No Typology Code available for payment source | Admitting: *Deleted

## 2021-05-28 ENCOUNTER — Ambulatory Visit: Payer: Self-pay | Admitting: Cardiology

## 2021-05-28 ENCOUNTER — Ambulatory Visit: Payer: No Typology Code available for payment source | Admitting: Cardiology

## 2021-06-04 DIAGNOSIS — I10 Essential (primary) hypertension: Secondary | ICD-10-CM | POA: Diagnosis not present

## 2021-06-04 DIAGNOSIS — J45909 Unspecified asthma, uncomplicated: Secondary | ICD-10-CM | POA: Diagnosis not present

## 2021-07-14 ENCOUNTER — Ambulatory Visit: Payer: No Typology Code available for payment source | Admitting: Cardiology

## 2021-11-03 ENCOUNTER — Telehealth: Payer: Self-pay | Admitting: Diagnostic Neuroimaging

## 2022-10-06 DIAGNOSIS — J189 Pneumonia, unspecified organism: Secondary | ICD-10-CM | POA: Diagnosis not present

## 2022-10-06 DIAGNOSIS — E876 Hypokalemia: Secondary | ICD-10-CM | POA: Diagnosis not present

## 2022-10-06 DIAGNOSIS — R059 Cough, unspecified: Secondary | ICD-10-CM | POA: Diagnosis not present

## 2022-10-06 DIAGNOSIS — I10 Essential (primary) hypertension: Secondary | ICD-10-CM | POA: Diagnosis not present

## 2022-10-06 DIAGNOSIS — R531 Weakness: Secondary | ICD-10-CM | POA: Diagnosis not present

## 2022-10-06 DIAGNOSIS — R000 Tachycardia, unspecified: Secondary | ICD-10-CM | POA: Diagnosis not present

## 2022-10-06 DIAGNOSIS — M791 Myalgia, unspecified site: Secondary | ICD-10-CM | POA: Diagnosis not present

## 2022-10-16 DIAGNOSIS — H43393 Other vitreous opacities, bilateral: Secondary | ICD-10-CM | POA: Diagnosis not present

## 2022-10-16 DIAGNOSIS — H35033 Hypertensive retinopathy, bilateral: Secondary | ICD-10-CM | POA: Diagnosis not present

## 2022-10-16 DIAGNOSIS — H10013 Acute follicular conjunctivitis, bilateral: Secondary | ICD-10-CM | POA: Diagnosis not present

## 2022-10-22 DIAGNOSIS — K529 Noninfective gastroenteritis and colitis, unspecified: Secondary | ICD-10-CM | POA: Diagnosis not present

## 2022-10-22 DIAGNOSIS — F10139 Alcohol abuse with withdrawal, unspecified: Secondary | ICD-10-CM | POA: Diagnosis not present

## 2022-10-22 DIAGNOSIS — E612 Magnesium deficiency: Secondary | ICD-10-CM | POA: Diagnosis not present

## 2022-10-22 DIAGNOSIS — F10231 Alcohol dependence with withdrawal delirium: Secondary | ICD-10-CM | POA: Diagnosis not present

## 2022-10-22 DIAGNOSIS — F10930 Alcohol use, unspecified with withdrawal, uncomplicated: Secondary | ICD-10-CM | POA: Diagnosis not present

## 2022-10-22 DIAGNOSIS — E876 Hypokalemia: Secondary | ICD-10-CM | POA: Diagnosis not present

## 2022-10-22 DIAGNOSIS — F419 Anxiety disorder, unspecified: Secondary | ICD-10-CM | POA: Diagnosis not present

## 2022-10-22 DIAGNOSIS — Z881 Allergy status to other antibiotic agents status: Secondary | ICD-10-CM | POA: Diagnosis not present

## 2022-10-22 DIAGNOSIS — F32A Depression, unspecified: Secondary | ICD-10-CM | POA: Diagnosis not present

## 2022-10-22 DIAGNOSIS — Z87891 Personal history of nicotine dependence: Secondary | ICD-10-CM | POA: Diagnosis not present

## 2022-10-22 DIAGNOSIS — R1110 Vomiting, unspecified: Secondary | ICD-10-CM | POA: Diagnosis not present

## 2022-10-22 DIAGNOSIS — Z79899 Other long term (current) drug therapy: Secondary | ICD-10-CM | POA: Diagnosis not present

## 2022-10-22 DIAGNOSIS — I10 Essential (primary) hypertension: Secondary | ICD-10-CM | POA: Diagnosis not present

## 2022-10-22 DIAGNOSIS — Z3202 Encounter for pregnancy test, result negative: Secondary | ICD-10-CM | POA: Diagnosis not present

## 2022-10-23 DIAGNOSIS — K529 Noninfective gastroenteritis and colitis, unspecified: Secondary | ICD-10-CM | POA: Diagnosis not present

## 2022-10-24 DIAGNOSIS — F10930 Alcohol use, unspecified with withdrawal, uncomplicated: Secondary | ICD-10-CM | POA: Diagnosis not present

## 2022-10-24 DIAGNOSIS — K529 Noninfective gastroenteritis and colitis, unspecified: Secondary | ICD-10-CM | POA: Diagnosis not present

## 2022-12-09 DIAGNOSIS — E559 Vitamin D deficiency, unspecified: Secondary | ICD-10-CM | POA: Diagnosis not present

## 2022-12-09 DIAGNOSIS — Z0001 Encounter for general adult medical examination with abnormal findings: Secondary | ICD-10-CM | POA: Diagnosis not present

## 2022-12-09 DIAGNOSIS — E669 Obesity, unspecified: Secondary | ICD-10-CM | POA: Diagnosis not present

## 2022-12-09 DIAGNOSIS — I10 Essential (primary) hypertension: Secondary | ICD-10-CM | POA: Diagnosis not present

## 2022-12-09 DIAGNOSIS — E538 Deficiency of other specified B group vitamins: Secondary | ICD-10-CM | POA: Diagnosis not present

## 2022-12-09 DIAGNOSIS — F1020 Alcohol dependence, uncomplicated: Secondary | ICD-10-CM | POA: Diagnosis not present

## 2023-02-02 IMAGING — CR DG CHEST 2V
2 series · 2 of 2 positions shown · non-contrast
Comparison: None.

CLINICAL DATA: Mid chest pain, shortness of breath and weakness.

EXAM:
CHEST - 2 VIEW

[chest pa]
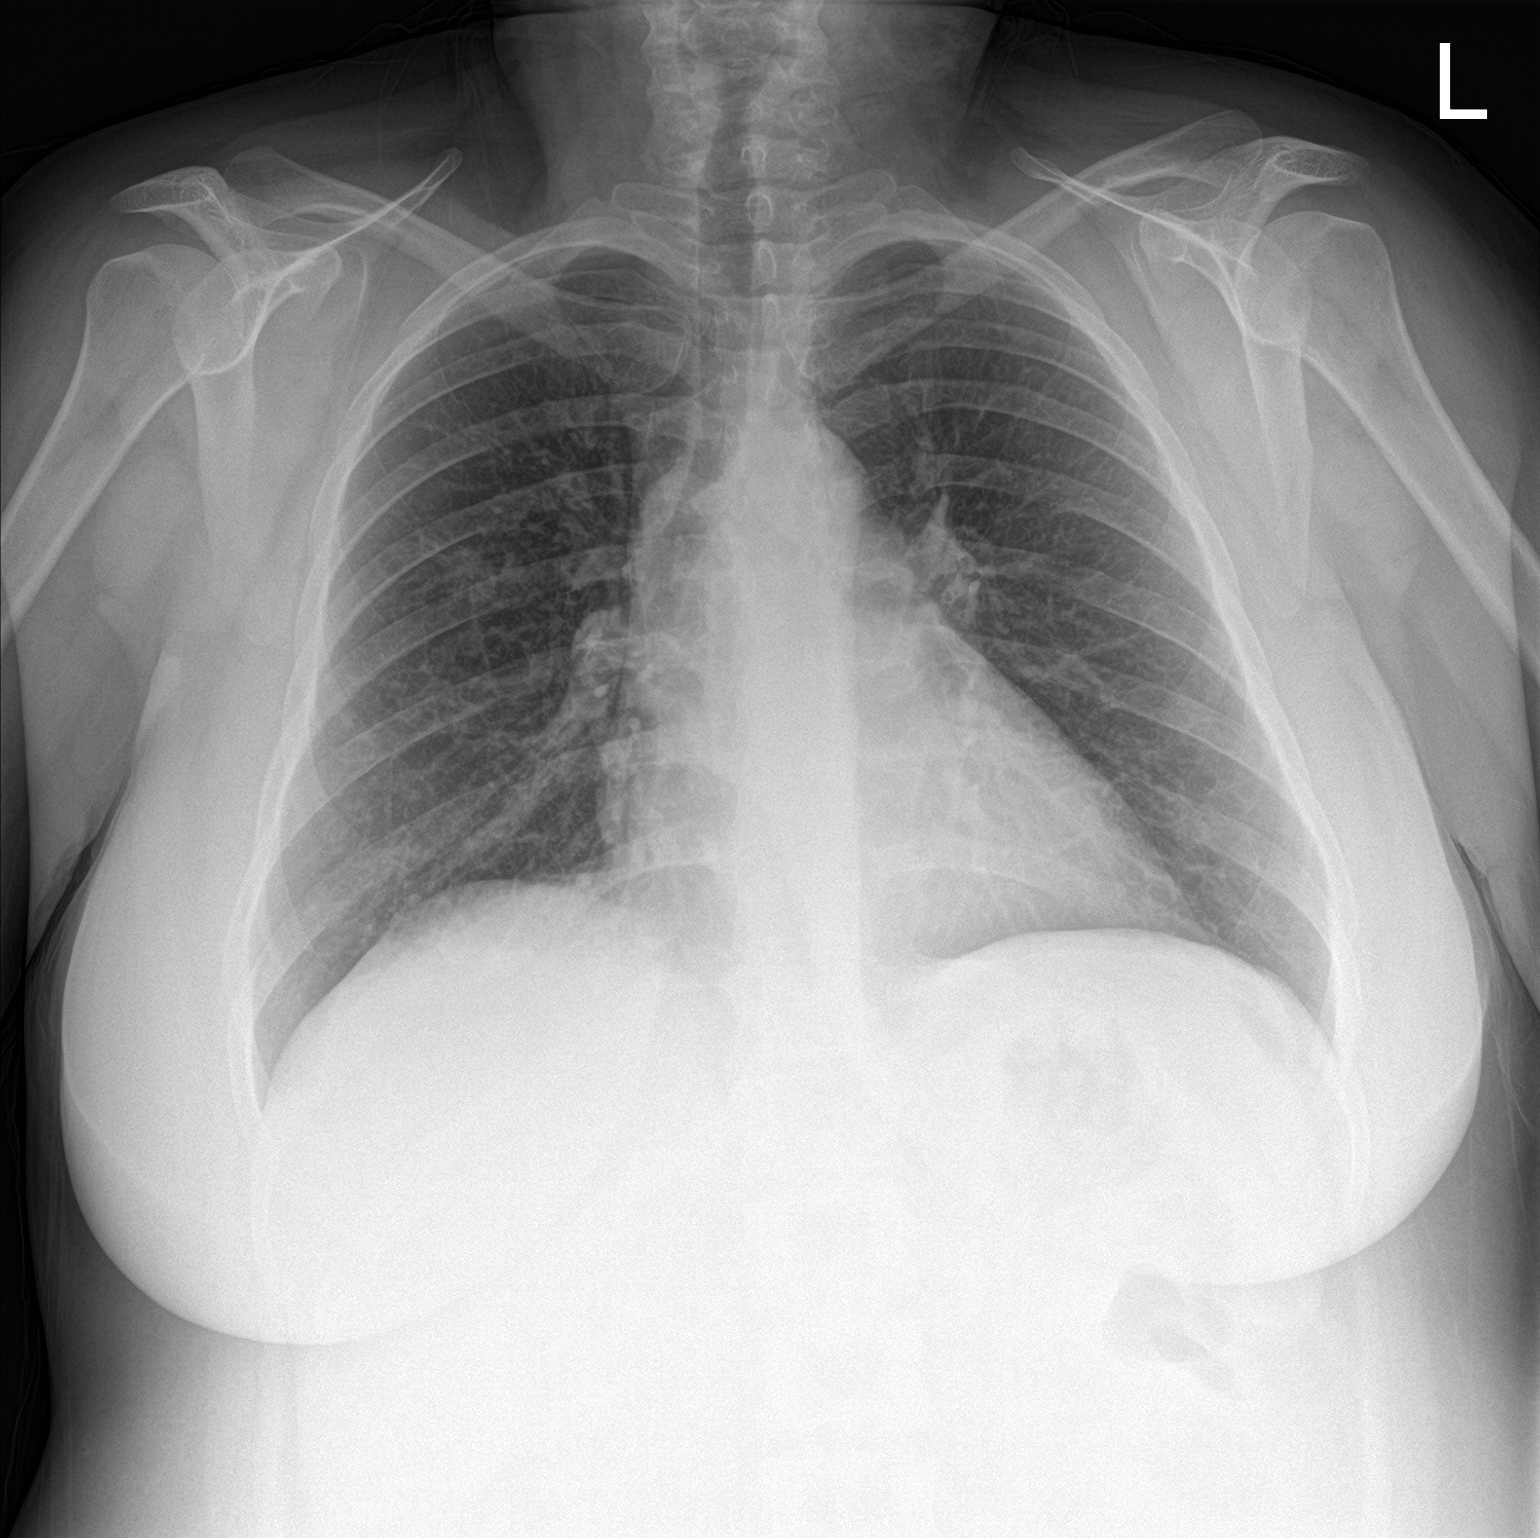

[chest lat]
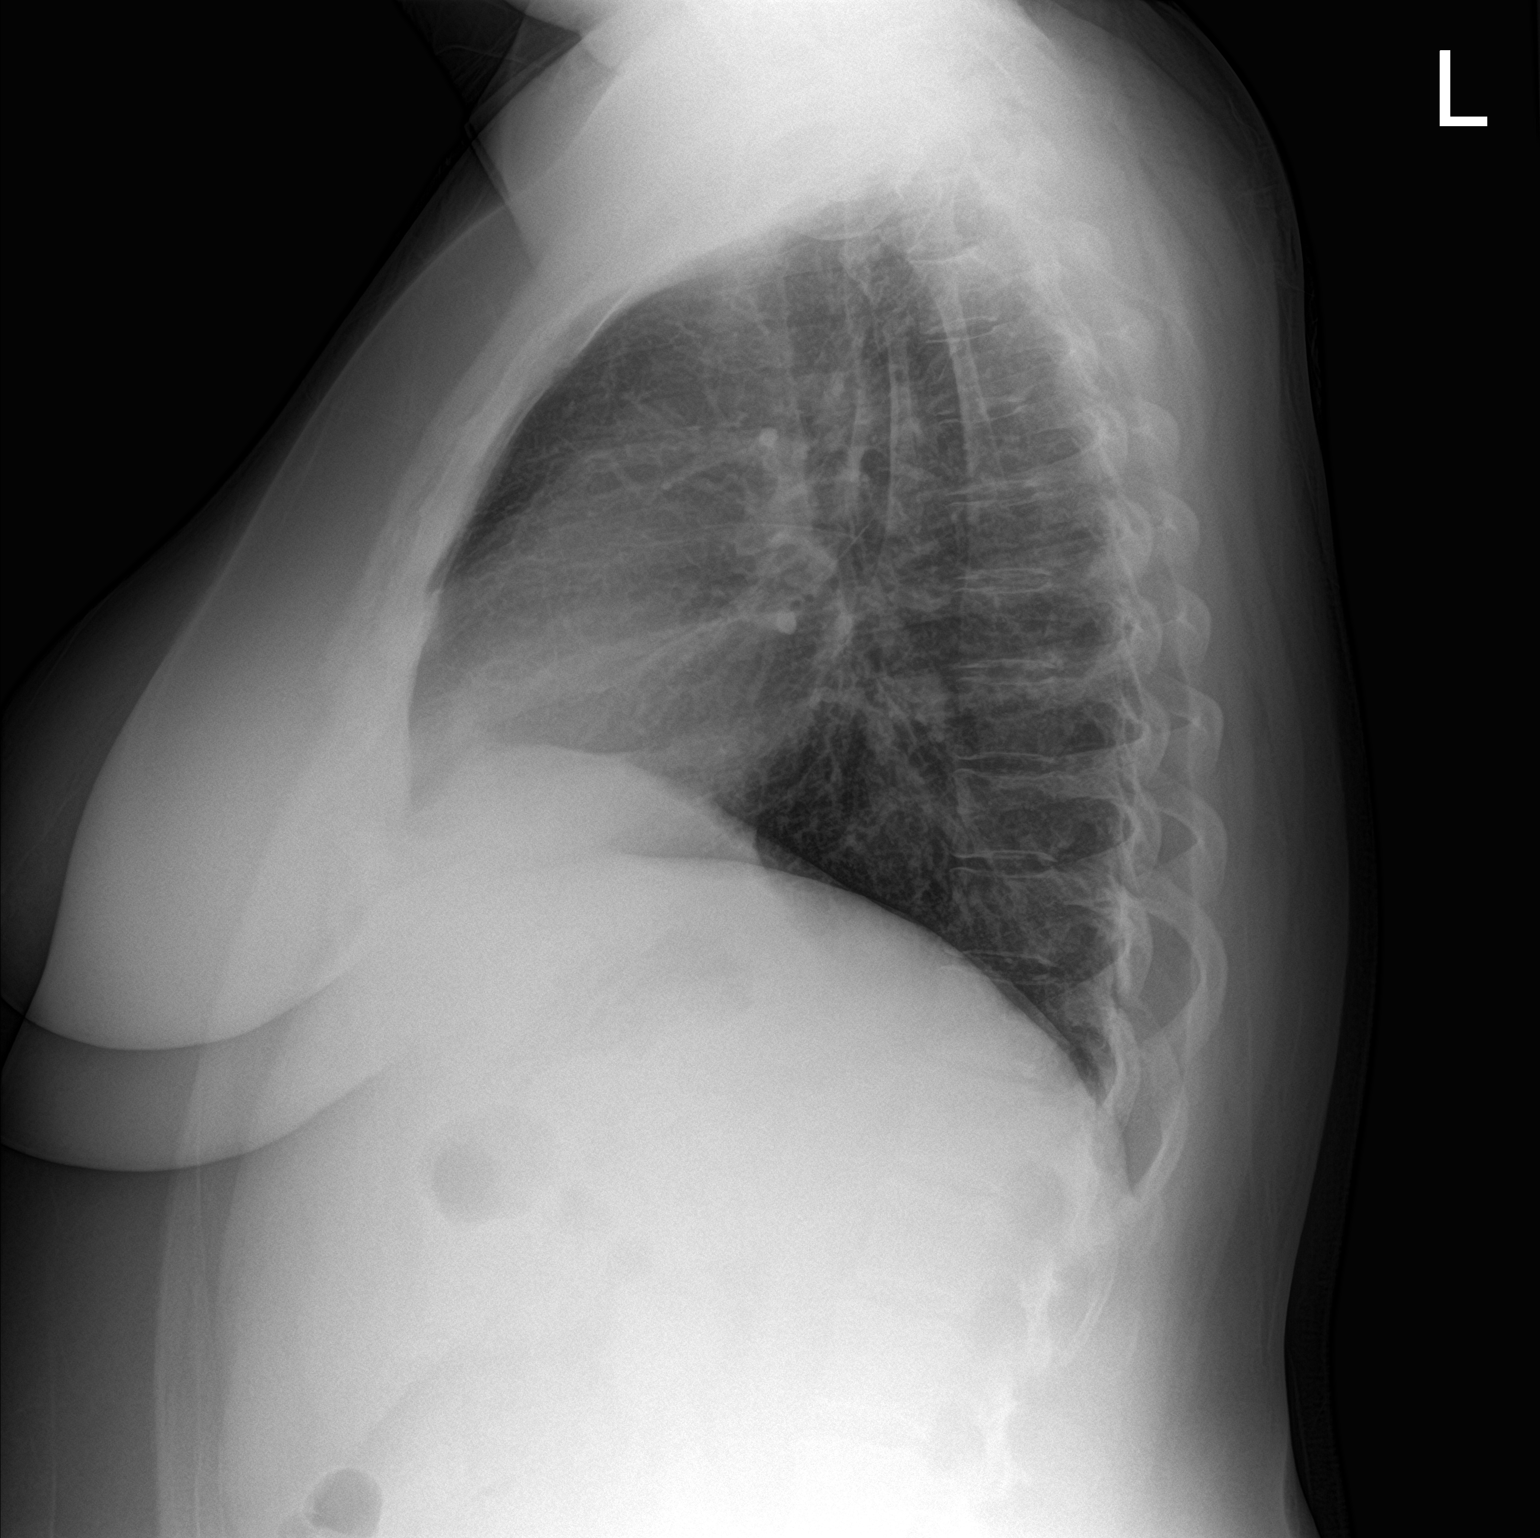

[2 of 2 positions shown; findings below may reference images not displayed]

FINDINGS: Lungs clear. Heart size normal. No pneumothorax or pleural fluid. No
acute or focal bony abnormality.
IMPRESSION: Negative chest.

## 2023-02-14 DIAGNOSIS — R569 Unspecified convulsions: Secondary | ICD-10-CM | POA: Diagnosis not present

## 2023-02-14 DIAGNOSIS — S0990XA Unspecified injury of head, initial encounter: Secondary | ICD-10-CM | POA: Diagnosis not present

## 2023-02-14 DIAGNOSIS — F32A Depression, unspecified: Secondary | ICD-10-CM | POA: Diagnosis not present

## 2023-02-14 DIAGNOSIS — R42 Dizziness and giddiness: Secondary | ICD-10-CM | POA: Diagnosis not present

## 2023-02-14 DIAGNOSIS — Z79899 Other long term (current) drug therapy: Secondary | ICD-10-CM | POA: Diagnosis not present

## 2023-02-14 DIAGNOSIS — R55 Syncope and collapse: Secondary | ICD-10-CM | POA: Diagnosis not present

## 2023-02-14 DIAGNOSIS — I10 Essential (primary) hypertension: Secondary | ICD-10-CM | POA: Diagnosis not present

## 2023-02-14 DIAGNOSIS — Z87891 Personal history of nicotine dependence: Secondary | ICD-10-CM | POA: Diagnosis not present

## 2023-02-14 DIAGNOSIS — E876 Hypokalemia: Secondary | ICD-10-CM | POA: Diagnosis not present

## 2023-02-14 DIAGNOSIS — F419 Anxiety disorder, unspecified: Secondary | ICD-10-CM | POA: Diagnosis not present

## 2023-02-14 DIAGNOSIS — Z881 Allergy status to other antibiotic agents status: Secondary | ICD-10-CM | POA: Diagnosis not present

## 2023-02-14 DIAGNOSIS — R410 Disorientation, unspecified: Secondary | ICD-10-CM | POA: Diagnosis not present

## 2023-02-14 DIAGNOSIS — R790 Abnormal level of blood mineral: Secondary | ICD-10-CM | POA: Diagnosis not present

## 2023-02-15 DIAGNOSIS — R55 Syncope and collapse: Secondary | ICD-10-CM | POA: Diagnosis not present

## 2023-02-15 DIAGNOSIS — E876 Hypokalemia: Secondary | ICD-10-CM | POA: Diagnosis not present

## 2023-02-15 DIAGNOSIS — R790 Abnormal level of blood mineral: Secondary | ICD-10-CM | POA: Diagnosis not present

## 2023-02-15 DIAGNOSIS — R569 Unspecified convulsions: Secondary | ICD-10-CM | POA: Diagnosis not present
